# Patient Record
Sex: Female | Born: 1980 | Race: White | Hispanic: No | Marital: Married | Smoking: Former smoker
Health system: Southern US, Community
[De-identification: ages and names within clinical notes are randomized; demographics above are authoritative.]

## PROBLEM LIST (undated history)

## (undated) DIAGNOSIS — F3281 Premenstrual dysphoric disorder: Secondary | ICD-10-CM

## (undated) DIAGNOSIS — Z87442 Personal history of urinary calculi: Secondary | ICD-10-CM

## (undated) DIAGNOSIS — K219 Gastro-esophageal reflux disease without esophagitis: Secondary | ICD-10-CM

## (undated) DIAGNOSIS — T7840XA Allergy, unspecified, initial encounter: Secondary | ICD-10-CM

## (undated) DIAGNOSIS — N943 Premenstrual tension syndrome: Secondary | ICD-10-CM

## (undated) HISTORY — DX: Gastro-esophageal reflux disease without esophagitis: K21.9

## (undated) HISTORY — DX: Premenstrual dysphoric disorder: F32.81

## (undated) HISTORY — DX: Premenstrual tension syndrome: N94.3

## (undated) HISTORY — DX: Allergy, unspecified, initial encounter: T78.40XA

## (undated) HISTORY — DX: Personal history of urinary calculi: Z87.442

---
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
## 2000-10-23 ENCOUNTER — Other Ambulatory Visit: Admission: RE | Admit: 2000-10-23 | Discharge: 2000-10-23 | Payer: Self-pay | Admitting: Obstetrics and Gynecology

## 2001-02-12 ENCOUNTER — Inpatient Hospital Stay (HOSPITAL_COMMUNITY): Admission: AD | Admit: 2001-02-12 | Discharge: 2001-02-12 | Payer: Self-pay | Admitting: Obstetrics and Gynecology

## 2001-04-21 ENCOUNTER — Inpatient Hospital Stay (HOSPITAL_COMMUNITY): Admission: AD | Admit: 2001-04-21 | Discharge: 2001-04-21 | Payer: Self-pay | Admitting: Obstetrics and Gynecology

## 2001-04-21 ENCOUNTER — Encounter: Payer: Self-pay | Admitting: *Deleted

## 2001-04-26 ENCOUNTER — Inpatient Hospital Stay (HOSPITAL_COMMUNITY): Admission: AD | Admit: 2001-04-26 | Discharge: 2001-04-26 | Payer: Self-pay | Admitting: Obstetrics and Gynecology

## 2001-05-29 ENCOUNTER — Inpatient Hospital Stay (HOSPITAL_COMMUNITY): Admission: AD | Admit: 2001-05-29 | Discharge: 2001-06-01 | Payer: Self-pay | Admitting: *Deleted

## 2001-07-09 ENCOUNTER — Other Ambulatory Visit: Admission: RE | Admit: 2001-07-09 | Discharge: 2001-07-09 | Payer: Self-pay | Admitting: Obstetrics and Gynecology

## 2002-07-11 ENCOUNTER — Other Ambulatory Visit: Admission: RE | Admit: 2002-07-11 | Discharge: 2002-07-11 | Payer: Self-pay | Admitting: Obstetrics and Gynecology

## 2003-08-07 ENCOUNTER — Other Ambulatory Visit: Admission: RE | Admit: 2003-08-07 | Discharge: 2003-08-07 | Payer: Self-pay | Admitting: Obstetrics and Gynecology

## 2006-09-05 ENCOUNTER — Ambulatory Visit: Payer: Self-pay | Admitting: Family Medicine

## 2008-01-27 ENCOUNTER — Ambulatory Visit: Payer: Self-pay | Admitting: Family Medicine

## 2008-02-17 ENCOUNTER — Ambulatory Visit: Payer: Self-pay | Admitting: Family Medicine

## 2008-03-16 ENCOUNTER — Ambulatory Visit: Payer: Self-pay | Admitting: Family Medicine

## 2008-06-03 ENCOUNTER — Ambulatory Visit: Payer: Self-pay | Admitting: Family Medicine

## 2009-04-27 ENCOUNTER — Ambulatory Visit: Payer: Self-pay | Admitting: Family Medicine

## 2009-11-16 ENCOUNTER — Ambulatory Visit: Payer: Self-pay | Admitting: Family Medicine

## 2009-12-07 ENCOUNTER — Ambulatory Visit: Payer: Self-pay | Admitting: Family Medicine

## 2009-12-07 ENCOUNTER — Encounter: Admission: RE | Admit: 2009-12-07 | Discharge: 2009-12-07 | Payer: Self-pay | Admitting: Family Medicine

## 2009-12-21 ENCOUNTER — Ambulatory Visit: Payer: Self-pay | Admitting: Physician Assistant

## 2010-09-09 NOTE — Consult Note (Signed)
[facility]  Patient:    Laura Mckee, Laura Mckee Visit Number: [id number] MRN: [id number]          Service Type: OBS Location: [facility] Attending Physician:  Esmeralda Arthur Dictated by:   Silverio Lay, M.D. Consultation Date: 02/12/01 Admit Date:  02/12/2001                            Consultation Report  EMERGENCY ROOM VISIT  REASON FOR CONSULTATION:      Gross hematuria.  HISTORY OF PRESENT ILLNESS:   This is a 30 year old, married, white female, gravida 2, para 1, abortus 0, with a due date of May 28, 2001, at 24 weeks pregnancy who called in tonight complaining of gross hematuria and lower pelvic soreness.  She reports being known in her previous pregnancy for urolithiasis kidney stone with no need for treatment.  She reports good fetal activity, denies any vaginal bleeding, denies any fever, denies any leakage of water.  The patient was asked to come to maternity admission to evaluate gross hematuria and to rule out any infectious process.  ALLERGIES:                    No known drug allergies.  PAST MEDICAL HISTORY:         October 1999, spontaneous vaginal delivery of a female infant at term with no complication.  FAMILY HISTORY:               Unremarkable.  SOCIAL HISTORY:               Married, nonsmoker, is a Runner, broadcasting/film/video.  PHYSICAL EXAMINATION:  VITAL SIGNS:                  Normal.  GENERAL:                      No acute distress.  HEENT:                        Negative.  LUNGS:                        Clear.  HEART:                        Normal.  ABDOMEN:                      Gravid, nontender.  Size adequate for dates.  No CVA tenderness, no rebound, no guarding.  EXTREMITIES:                  Negative.  Fetal heart rate baseline between 140 and 150, reassuring.  LABORATORY DATA:              UA shows straw-colored urine, large hemoglobin, otherwise negative.  CBC shows a normal white blood cell count, normal hemoglobin  at 12.4.  ASSESSMENT:                   Intrauterine pregnancy at 24+ weeks with acute episode of gross hematuria, now resolving with increased hydration.  No evidence of pyelonephritis or urinary tract infection.  This is probably secondary to kidney stone.  PLAN:  The patient is discharged home with reassurances, asked to increase her water intake, and to use Tylenol if necessary.  She declines any pain medication prescription.  She is instructed to follow up in the office in 48 hours as scheduled but to call if gross hematuria reoccurs or if pain increases or if fever develops. Dictated by:   Silverio Lay, M.D. Attending Physician:  Esmeralda Arthur DD:  02/12/01 TD:  02/12/01 Job: [id number]

## 2010-09-09 NOTE — H&P (Signed)
[facility]  Patient:    Laura Mckee, Laura Mckee Visit Number: [id number] MRN: [id number]          Service Type: OBS Location: 910B [id number] [id number] Attending Physician:  Ermalene Searing Dictated by:   Sheria Lang Cherly Hensen, M.D. Admit Date:  05/29/2001                           History and Physical  CHIEF COMPLAINT:              Postdates, induction of labor.  HISTORY OF PRESENT ILLNESS:   This is a 30 year old gravida 2 para 1, 0-0-1, female with an EDC of May 28, 2001 by ultrasound, who is now at 40-1/7th weeks gestation, admitted for induction of labor.  The patients prenatal course has been complicated by a kidney stone which she passed spontaneously; however, she has documented colonization by group B streptococcus with a positive urine culture as well as a positive anal/vaginal culture.  She is currently on Macrobid for a second urinary tract infection.  The patient has been noted to have size less than dates and several ultrasounds, including the last one on May 24, 2001, showed estimated fetal weight of 7 pounds 1 ounce, which is at the 24th percentile; normal amniotic fluid index.  The patient has not had any contractions.  Her cervix was 1, thick, and about -2 vertex presentation.  She has a history of a 2-1/2 hour labor in the past. Prenatal care is at [facility].  Primary obstetrician, Sheronette A. Cherly Hensen, M.D.  Blood type is O-positive.  Antibody screen negative.  RPR nonreactive.  Rubella immune.   Hepatitis B surface antigen negative.  HIV test nonreactive.  GC and Chlamydia cultures negative.  Pap smear normal.  The patient declined AFP 3 testing.  Anatomic fetal survey done on January 15, 2001 at 20.6 weeks was normal.  Limits were discussed at that time.  The patient had a normal one hour GCT.  Group B strep culture is positive. Patient initially presented with right flank pain in December 2002 with findings of moderate to  severe hydronephrosis, right greater than left, and subsequently had a kidney stone that passed.  The patient had been by urology with a positive urine culture of greater than 100,000 group B strep.  The patient was treated with Tylox until resolution of symptoms.  On May 24, 2001 the patient was complaining of gross hematuria.  Urine culture was sent, which was subsequently negative; however, the patient was started on Macrobid 100 mg p.o. b.i.d., which she will continue.  ALLERGIES:                    No known drug allergies.  MEDICATIONS:                  1. Prenatal vitamins.                               2. Macrobid.  PAST MEDICAL HISTORY:         Negative.  PAST SURGICAL HISTORY:        Negative.  PAST OBSTETRICAL HISTORY:     October 1999, 7 pounds 5 ounce baby, 2-1/2 hours labor; vaginal delivery.  FAMILY HISTORY:               Maternal grandfather, colon cancer.  SOCIAL HISTORY:  Married, one child.  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  REVIEW OF SYSTEMS:            Negative except as noted in the History of Present Illness.  PHYSICAL EXAMINATION:  GENERAL:                      Petite white gravid female, in no acute distress.  VITAL SIGNS:                  Blood pressure 110/70.  Weight 132.4 pounds.  SKIN:                         No lesions.  HEENT:                        Sclerae anicteric.  Conjunctivae pink. Oropharynx negative.  HEART:                        Regular rate and rhythm without murmur.  LUNGS:                        Clear to auscultation.  BREAST:                       Soft, nontender.  No palpable mass.  ABDOMEN:                      Gravid, fundal height 34 cm.  EXTREMITIES:                  No edema.  PELVIC:                       One, thick, -2 vertex presentation.  IMPRESSION:                   1. Term gestation.                               2. Group B streptococci culture positive.                                3. Presumed urinary tract infection.  PLAN:                         1. Admission.                               2. Cervical ripening.                               3. Pitocin augmentation/induction on                                  May 30, 2001.                               4. Continue Macrobid antibiotic regimen.  5. Routine admission orders and laboratories.                               5. Penicillin prophylaxis for the positive                                  group B strep culture. Dictated by:   Sheria Lang. Cherly Hensen, M.D. Attending Physician:  Marina Gravel B DD:  05/30/01 TD:  05/30/01 Job: [id number]

## 2011-01-03 LAB — HM PAP SMEAR: HM Pap smear: NORMAL

## 2011-03-02 ENCOUNTER — Ambulatory Visit (INDEPENDENT_AMBULATORY_CARE_PROVIDER_SITE_OTHER): Payer: BC Managed Care – PPO | Admitting: Internal Medicine

## 2011-03-02 ENCOUNTER — Encounter: Payer: Self-pay | Admitting: Internal Medicine

## 2011-03-02 VITALS — BP 90/60 | HR 84 | Temp 98.6°F | Resp 18 | Ht 59.5 in | Wt 96.0 lb

## 2011-03-02 DIAGNOSIS — J329 Chronic sinusitis, unspecified: Secondary | ICD-10-CM

## 2011-03-02 DIAGNOSIS — N390 Urinary tract infection, site not specified: Secondary | ICD-10-CM

## 2011-03-02 DIAGNOSIS — M549 Dorsalgia, unspecified: Secondary | ICD-10-CM

## 2011-03-02 HISTORY — PX: NO PAST SURGERIES: SHX2092

## 2011-03-02 LAB — BASIC METABOLIC PANEL
BUN: 14 mg/dL (ref 6–23)
CO2: 26 mEq/L (ref 19–32)
Calcium: 9.5 mg/dL (ref 8.4–10.5)
Chloride: 104 mEq/L (ref 96–112)
Creat: 0.71 mg/dL (ref 0.50–1.10)
Glucose, Bld: 74 mg/dL (ref 70–99)
Potassium: 3.8 mEq/L (ref 3.5–5.3)
Sodium: 141 mEq/L (ref 135–145)

## 2011-03-02 LAB — CBC WITH DIFFERENTIAL/PLATELET
Basophils Absolute: 0 10*3/uL (ref 0.0–0.1)
Basophils Relative: 0 % (ref 0–1)
Eosinophils Absolute: 0 10*3/uL (ref 0.0–0.7)
Eosinophils Relative: 0 % (ref 0–5)
HCT: 41.5 % (ref 36.0–46.0)
Hemoglobin: 14 g/dL (ref 12.0–15.0)
Lymphocytes Relative: 19 % (ref 12–46)
Lymphs Abs: 1.6 10*3/uL (ref 0.7–4.0)
MCH: 32.5 pg (ref 26.0–34.0)
MCHC: 33.7 g/dL (ref 30.0–36.0)
MCV: 96.3 fL (ref 78.0–100.0)
Monocytes Absolute: 0.6 10*3/uL (ref 0.1–1.0)
Monocytes Relative: 7 % (ref 3–12)
Neutro Abs: 6.2 10*3/uL (ref 1.7–7.7)
Neutrophils Relative %: 73 % (ref 43–77)
Platelets: 189 10*3/uL (ref 150–400)
RBC: 4.31 MIL/uL (ref 3.87–5.11)
RDW: 12.3 % (ref 11.5–15.5)
WBC: 8.5 10*3/uL (ref 4.0–10.5)

## 2011-03-02 LAB — URINALYSIS, ROUTINE W REFLEX MICROSCOPIC
Bilirubin Urine: NEGATIVE
Glucose, UA: NEGATIVE mg/dL
Ketones, ur: NEGATIVE mg/dL
Leukocytes, UA: NEGATIVE
Nitrite: NEGATIVE
Protein, ur: NEGATIVE mg/dL
Specific Gravity, Urine: 1.025 (ref 1.005–1.030)
Urobilinogen, UA: 0.2 mg/dL (ref 0.0–1.0)
pH: 5.5 (ref 5.0–8.0)

## 2011-03-02 MED ORDER — LEVOFLOXACIN 500 MG PO TABS: 500.0000 mg | ORAL_TABLET | Freq: Every day | ORAL | Status: AC

## 2011-03-02 NOTE — Progress Notes (Signed)
  Subjective:    Patient ID: Laura Mckee, female    DOB: 1980-10-25, 30 y.o.   MRN: [id number]  HPI Pt presents to clinic for evaluation of back pain. Has noted increase in urinary frequency and lbp not affected by position and not related to injury. UA obtained by gyn ~4wks ago and was given 3d course of septra. Notes improvement of urine freq but not back pain. aloso notes sinus pain/pressure, nasal congestion and drainage for 5-6d. No fever chills. No alleviating or exacerbating factors.  Reviewed pmh, psh, medications, allergies, soc hx and fam hx  Review of Systems  Constitutional: Negative for fever and chills.  Genitourinary: Positive for frequency. Negative for hematuria.  Musculoskeletal: Positive for back pain.  All other systems reviewed and are negative.       Objective:   Physical Exam  Nursing note and vitals reviewed. Constitutional: She appears well-developed and well-nourished. No distress.  HENT:  Head: Normocephalic and atraumatic.  Right Ear: External ear normal.  Left Ear: External ear normal.  Eyes: Conjunctivae are normal. No scleral icterus.  Neck: Neck supple.  Abdominal: Soft. She exhibits no distension. There is no tenderness.  Musculoskeletal: She exhibits no tenderness.       Lumbar back: She exhibits normal range of motion, no tenderness, no bony tenderness and no spasm.  Neurological: She is alert.  Skin: Skin is warm and dry. She is not diaphoretic.  Psychiatric: She has a normal mood and affect.          Assessment & Plan:

## 2011-03-03 LAB — URINALYSIS, MICROSCOPIC ONLY
Bacteria, UA: NONE SEEN
Casts: NONE SEEN
Crystals: NONE SEEN

## 2011-03-04 LAB — URINE CULTURE: Colony Count: 4000

## 2011-03-05 DIAGNOSIS — M549 Dorsalgia, unspecified: Secondary | ICD-10-CM | POA: Insufficient documentation

## 2011-03-05 DIAGNOSIS — J329 Chronic sinusitis, unspecified: Secondary | ICD-10-CM | POA: Insufficient documentation

## 2011-03-05 NOTE — Assessment & Plan Note (Signed)
Attempt po course of abx. Followup if no improvement or worsening.

## 2011-03-05 NOTE — Assessment & Plan Note (Signed)
Obtain cbc, chem7, ua and cx. Attempt 7d course of levaquin. Followup if no improvement or worsening.

## 2011-03-09 ENCOUNTER — Ambulatory Visit: Payer: BC Managed Care – PPO | Admitting: Internal Medicine

## 2011-03-09 DIAGNOSIS — Z0289 Encounter for other administrative examinations: Secondary | ICD-10-CM

## 2011-05-21 ENCOUNTER — Emergency Department (HOSPITAL_COMMUNITY)
Admission: EM | Admit: 2011-05-21 | Discharge: 2011-05-22 | Disposition: A | Discharge status: Home / Self Care | Payer: BC Managed Care – PPO | Attending: Emergency Medicine | Admitting: Emergency Medicine

## 2011-05-21 ENCOUNTER — Encounter (HOSPITAL_COMMUNITY): Payer: Self-pay | Admitting: Emergency Medicine

## 2011-05-21 DIAGNOSIS — R110 Nausea: Secondary | ICD-10-CM | POA: Insufficient documentation

## 2011-05-21 DIAGNOSIS — K219 Gastro-esophageal reflux disease without esophagitis: Secondary | ICD-10-CM | POA: Insufficient documentation

## 2011-05-21 DIAGNOSIS — R1011 Right upper quadrant pain: Secondary | ICD-10-CM | POA: Insufficient documentation

## 2011-05-21 LAB — CBC
HCT: 39 % (ref 36.0–46.0)
Hemoglobin: 13.7 g/dL (ref 12.0–15.0)
MCH: 32.5 pg (ref 26.0–34.0)
RBC: 4.21 MIL/uL (ref 3.87–5.11)

## 2011-05-21 LAB — COMPREHENSIVE METABOLIC PANEL
ALT: 14 U/L (ref 0–35)
AST: 16 U/L (ref 0–37)
Albumin: 4.5 g/dL (ref 3.5–5.2)
Alkaline Phosphatase: 54 U/L (ref 39–117)
Calcium: 10 mg/dL (ref 8.4–10.5)
GFR calc Af Amer: >90 mL/min (ref >90)
Glucose, Bld: 98 mg/dL (ref 70–99)
Potassium: 3.8 mEq/L (ref 3.5–5.1)
Sodium: 140 mEq/L (ref 135–145)
Total Protein: 7.2 g/dL (ref 6.0–8.3)

## 2011-05-21 LAB — DIFFERENTIAL
Eosinophils Absolute: 0 10*3/uL (ref 0.0–0.7)
Lymphs Abs: 1.8 10*3/uL (ref 0.7–4.0)
Monocytes Absolute: 0.6 10*3/uL (ref 0.1–1.0)
Monocytes Relative: 7 % (ref 3–12)
Neutrophils Relative %: 72 % (ref 43–77)

## 2011-05-21 LAB — URINALYSIS, ROUTINE W REFLEX MICROSCOPIC
Leukocytes, UA: NEGATIVE
Nitrite: NEGATIVE
Specific Gravity, Urine: 1.014 (ref 1.005–1.030)
Urobilinogen, UA: 0.2 mg/dL (ref 0.0–1.0)
pH: 6 (ref 5.0–8.0)

## 2011-05-21 LAB — POCT PREGNANCY, URINE: Preg Test, Ur: NEGATIVE

## 2011-05-21 LAB — URINE MICROSCOPIC-ADD ON

## 2011-05-21 NOTE — ED Notes (Signed)
C/o RUQ pain, nausea, and constipation x 1 week.

## 2011-05-21 NOTE — ED Provider Notes (Signed)
History     CSN: [id number]  Arrival date & time 05/21/11  1810   First MD Initiated Contact with Patient 05/21/11 1958      Chief Complaint  Patient presents with  . Abdominal Pain    (Consider location/radiation/quality/duration/timing/severity/associated sxs/prior treatment) HPI Complains of right upper quadrant pain radiating to right flank onset approximately 2 weeks ago accompanied by nausea. Worse with eating. No fever no other complaint no treatment prior to coming here pain not improved by anything. Pain is mild at present has never had similar pain before. Pain is dull in nature. Past Medical History  Diagnosis Date  . PMS (premenstrual syndrome)     depression  . GERD (gastroesophageal reflux disease)   . History of kidney stones 1999 & 2003    x 2 with pregnancies    Past Surgical History  Procedure Date  . No past surgeries 03/02/2011    Denies surgical history    Family History  Problem Relation Age of Onset  . Colon cancer Maternal Grandfather     History  Substance Use Topics  . Smoking status: Former Games developer  . Smokeless tobacco: Never Used   Comment: 1/2 pack a day  for  3 years  . Alcohol Use: No    OB History    Grav Para Term Preterm Abortions TAB SAB Ect Mult Living                  Review of Systems  Constitutional: Negative.   HENT: Negative.   Respiratory: Negative.   Cardiovascular: Negative.   Gastrointestinal: Positive for nausea and abdominal pain.  Musculoskeletal: Negative.   Skin: Negative.   Neurological: Negative.   Hematological: Negative.   Psychiatric/Behavioral: Negative.   All other systems reviewed and are negative.    Allergies  Review of patient's allergies indicates no known allergies.  Home Medications   Current Outpatient Rx  Name Route Sig Dispense Refill  . SERTRALINE HCL 50 MG PO TABS Oral Take 50 mg by mouth every morning.       BP 109/76  Pulse 88  Temp(Src) 98.4 F (36.9 C) (Oral)  Resp  20  SpO2 99%  LMP 05/16/2011  Physical Exam  Nursing note and vitals reviewed. Constitutional: She appears well-developed and well-nourished.  HENT:  Head: Normocephalic and atraumatic.  Eyes: Conjunctivae are normal. Pupils are equal, round, and reactive to light.  Neck: Neck supple. No tracheal deviation present. No thyromegaly present.  Cardiovascular: Normal rate and regular rhythm.   No murmur heard. Pulmonary/Chest: Effort normal and breath sounds normal.  Abdominal: Soft. Bowel sounds are normal. She exhibits no distension and no mass. There is tenderness. There is no rebound and no guarding.       Mildly tender right upper quadrant no guarding rigidity or rebound  Musculoskeletal: Normal range of motion. She exhibits no edema and no tenderness.  Neurological: She is alert. Coordination normal.  Skin: Skin is warm and dry. No rash noted.  Psychiatric: She has a normal mood and affect.    ED Course  Procedures (including critical care time)  Labs Reviewed  URINALYSIS, ROUTINE W REFLEX MICROSCOPIC - Abnormal; Notable for the following:    Hgb urine dipstick MODERATE (*)    All other components within normal limits  URINE MICROSCOPIC-ADD ON - Abnormal; Notable for the following:    Squamous Epithelial / LPF FEW (*)    All other components within normal limits  POCT PREGNANCY, URINE  CBC  DIFFERENTIAL  POCT PREGNANCY, URINE  COMPREHENSIVE METABOLIC PANEL  LIPASE, BLOOD  POCT PREGNANCY, URINE   No results found.   No diagnosis found.    MDM   toorder abdominal ultrasound 10:30 PM as patient last ate at 4:30 PM today Suspect biliary colic Diagnosis abdominal pain        Doug Sou, MD 05/21/11 2112

## 2011-05-21 NOTE — ED Notes (Signed)
Report given to Christa RN

## 2011-05-21 NOTE — ED Provider Notes (Signed)
10:52 PM Pending ultrasound. Patient continues to have abdominal pain but does not require medication. Reports intermittent right upper quadrant pain, and nausea after eating for one week. Also reports significant constipation. Patient is slightly tender in the epigastric region. No rigidity, rebound, or guarding. Bowel sounds within normal limits. Vital signs normal. Dr. Rennis Chris will continue to manage patient.  Thomasene Lot, PA-C 05/21/11 2257

## 2011-05-21 NOTE — ED Notes (Signed)
Pt states last meal at 4:30 pm 05/21/2011

## 2011-05-22 ENCOUNTER — Telehealth: Payer: Self-pay | Admitting: *Deleted

## 2011-05-22 ENCOUNTER — Emergency Department (HOSPITAL_COMMUNITY): Payer: BC Managed Care – PPO

## 2011-05-22 MED ORDER — OXYCODONE-ACETAMINOPHEN 5-325 MG PO TABS: 1.0000 | ORAL_TABLET | ORAL | Status: AC | PRN

## 2011-05-22 MED ORDER — ONDANSETRON 4 MG PO TBDP: 8.0000 mg | ORAL_TABLET | Freq: Once | ORAL | Status: DC

## 2011-05-22 MED ORDER — OXYCODONE-ACETAMINOPHEN 5-325 MG PO TABS: 1.0000 | ORAL_TABLET | Freq: Once | ORAL | Status: DC

## 2011-05-22 MED ORDER — FAMOTIDINE 20 MG PO TABS: 20.0000 mg | ORAL_TABLET | Freq: Two times a day (BID) | ORAL | Status: DC

## 2011-05-22 NOTE — ED Notes (Signed)
Patient transported to Ultrasound 

## 2011-05-22 NOTE — ED Provider Notes (Signed)
Medical screening examination/treatment/procedure(s) were conducted as a shared visit with non-physician practitioner(s) and myself.  I personally evaluated the patient during the encounter  Doug Sou, MD 05/22/11 [phone]

## 2011-05-22 NOTE — Telephone Encounter (Signed)
Call Report Triage Record Num: [id number] Operator: Kelle Darting Patient Name: Laura Mckee Call Date & Time: 05/21/2011 5:06:30PM Patient Phone: [phone] PCP: Marguarite Arbour Patient Gender: Female PCP Fax : [phone] Patient DOB: May 28, 1980 Practice Name: [facility] Reason for Call: Caller: Correna/Patient; PCP: Marguarite Arbour; CB#: [phone]; Call regarding Pain under right side of rib cage, Fever; LMP: 05/14/11; Temp. at 99.3 (ax) 1630; Onset: 05/20/11; Sx notes: Has had this pain coming and going for the past three weeks but has gotten constant yesterday, and today has progressed to her Rt. upper back/shoulder blade area that comes and goes, also lower back as well, some nausea, also more belching, pain worse right after eating; Guideline used: Abd. pain; Disp: See ED Immediately due to abd. pain that has progressed to a localized pain and loss of appetite, nausea, fever; Appt. made: No. Protocol(s) Used: Abdominal Pain Recommended Outcome per Protocol: See ED Immediately Reason for Outcome: Generalized abdominal pain that progresses to localized pain AND any of the following: loss of appetite, vomiting starting after pain, any fever, OR unable to carry out normal activities Care Advice: ~ Another adult should drive. ~ Do not eat or drink anything until evaluated by provider. 05/21/2011 5:20:52PM Page 1 of 1 [email or web address]

## 2011-05-22 NOTE — ED Provider Notes (Signed)
Labs/imaging negative BP 109/76  Pulse 88  Temp(Src) 98.4 F (36.9 C) (Oral)  Resp 20  SpO2 99%  LMP 05/16/2011 Pt stable at this time abd soft, no lower abd tenderness No cp/sob reported Well appearing Discussed strict return precautions Stable for d/c   Joya Gaskins, MD 05/22/11 [phone]

## 2011-05-22 NOTE — ED Notes (Signed)
Phoned ultrasound to inquire about estimated time for patient scan. Tech will come and get patient shortly. Delayed explained to patient and family. Verbalized understanding.

## 2011-05-25 ENCOUNTER — Telehealth: Payer: Self-pay | Admitting: *Deleted

## 2011-05-25 NOTE — Telephone Encounter (Signed)
Pt.notified

## 2011-05-25 NOTE — Telephone Encounter (Signed)
ED note says ruq pain and [place] and labs were done. If constipation concern then colace 100mg  bid. Take 1/2 bottle of mag citrate over ice. Repeat in 2 hours if no bm. If no bm or sx's continue then needs to be seen

## 2011-05-25 NOTE — Telephone Encounter (Signed)
Patient called and left voice message stating she has not had a regular bowel movement for the past 2-3 weeks, and she is having to strain to have a bowel movement when she does. She would like to know if there is something Dr Rodena Medin would suggest overt the counter for relief. She states she was seen in the Er and she continues to have abdominal cramping and discomfort.

## 2012-01-02 ENCOUNTER — Encounter: Payer: Self-pay | Admitting: Internal Medicine

## 2012-01-02 ENCOUNTER — Ambulatory Visit (INDEPENDENT_AMBULATORY_CARE_PROVIDER_SITE_OTHER): Payer: BC Managed Care – PPO | Admitting: Internal Medicine

## 2012-01-02 VITALS — BP 100/70 | HR 77 | Temp 98.2°F | Resp 16 | Wt 107.0 lb

## 2012-01-02 DIAGNOSIS — M25539 Pain in unspecified wrist: Secondary | ICD-10-CM

## 2012-01-02 DIAGNOSIS — M549 Dorsalgia, unspecified: Secondary | ICD-10-CM

## 2012-01-02 MED ORDER — DICLOFENAC SODIUM 50 MG PO TBEC: DELAYED_RELEASE_TABLET | ORAL | Status: AC

## 2012-01-02 NOTE — Progress Notes (Signed)
  Subjective:    Patient ID: Laura Mckee, female    DOB: June 07, 1980, 31 y.o.   MRN: [id number]  HPI Pt presents to clinic for evaluation of wrist pain. Notes six week history of left wrist pain with a tender area over the dorsal aspect approximately last three weeks. No injury or trauma. Pain occurred after using lawn equipment. Taking Motrin when necessary. No other alleviating or exacerbating factors. Also notes mild thoracic back pain for the past two weeks without radicular symptoms injury or trauma.   Past Medical History  Diagnosis Date  . PMS (premenstrual syndrome)     depression  . GERD (gastroesophageal reflux disease)   . History of kidney stones 1999 & 2003    x 2 with pregnancies   Past Surgical History  Procedure Date  . No past surgeries 03/02/2011    Denies surgical history    reports that she has quit smoking. She has never used smokeless tobacco. She reports that she does not drink alcohol or use illicit drugs. family history includes Colon cancer in her maternal grandfather. No Known Allergies   Review of Systems see hpi     Objective:   Physical Exam  Nursing note and vitals reviewed. Constitutional: She appears well-developed and well-nourished. No distress.  HENT:  Head: Normocephalic and atraumatic.  Musculoskeletal:       Left wrist-full range of motion. Mild tenderness along the dorsal tendon lines. No bony abnormality. No erythema warmth or effusion. Sensation grossly intact. +2 radial pulse  Neurological: She is alert.  Skin: She is not diaphoretic.  Psychiatric: She has a normal mood and affect.          Assessment & Plan:

## 2012-01-06 DIAGNOSIS — M25539 Pain in unspecified wrist: Secondary | ICD-10-CM | POA: Insufficient documentation

## 2012-01-06 NOTE — Assessment & Plan Note (Signed)
Suspect musculoskeletal etiology. Attempt Voltaren when necessary. Follow up with no improvement or worsening.

## 2012-01-06 NOTE — Assessment & Plan Note (Signed)
Suspect tendinitis. Stop Motrin. Attempt Voltaren. Followup if no improvement or worsening.

## 2012-07-25 ENCOUNTER — Ambulatory Visit (INDEPENDENT_AMBULATORY_CARE_PROVIDER_SITE_OTHER): Payer: BC Managed Care – PPO | Admitting: Family

## 2012-07-25 ENCOUNTER — Encounter: Payer: Self-pay | Admitting: Family

## 2012-07-25 VITALS — BP 100/76 | HR 104 | Temp 98.4°F | Resp 16 | Wt 103.0 lb

## 2012-07-25 DIAGNOSIS — G5620 Lesion of ulnar nerve, unspecified upper limb: Secondary | ICD-10-CM

## 2012-07-25 DIAGNOSIS — M549 Dorsalgia, unspecified: Secondary | ICD-10-CM

## 2012-07-25 DIAGNOSIS — G5622 Lesion of ulnar nerve, left upper limb: Secondary | ICD-10-CM

## 2012-07-25 MED ORDER — MELOXICAM 7.5 MG PO TABS: 7.5000 mg | ORAL_TABLET | Freq: Every day | ORAL | Status: DC

## 2012-07-25 NOTE — Progress Notes (Signed)
  Subjective:    Patient ID: Laura Mckee, female    DOB: 1981/02/24, 32 y.o.   MRN: [id number]  HPI  [name] is a 32 yr old female who presents today with two concerns:  1) Wrist pain- left wrist.  Occasional tingling 4th and 5th fingers.  She saw Dr. Rodena Medin 7 months ago for this.  Off/on since that time.   2) Back Pain- Reports that her back pain is located in the mid back and wraps around the right side of her back.  Pain is on/off.  Pain is improved by ice/warm compresses help briefly.  Pain is worse with rotating her back/turning.    Review of Systems See HPI  Past Medical History  Diagnosis Date  . PMS (premenstrual syndrome)     depression  . GERD (gastroesophageal reflux disease)   . History of kidney stones 1999 & 2003    x 2 with pregnancies    History   Social History  . Marital Status: Married    Spouse Name: N/A    Number of Children: N/A  . Years of Education: N/A   Occupational History  . Not on file.   Social History Main Topics  . Smoking status: Former Games developer  . Smokeless tobacco: Never Used     Comment: 1/2 pack a day  for  3 years  . Alcohol Use: No  . Drug Use: No  . Sexually Active: Not on file   Other Topics Concern  . Not on file   Social History Narrative  . No narrative on file    Past Surgical History  Procedure Laterality Date  . No past surgeries  03/02/2011    Denies surgical history    Family History  Problem Relation Age of Onset  . Colon cancer Maternal Grandfather     No Known Allergies  Current Outpatient Prescriptions on File Prior to Visit  Medication Sig Dispense Refill  . sertraline (ZOLOFT) 50 MG tablet Take 50 mg by mouth every morning.        No current facility-administered medications on file prior to visit.    BP 100/76  Pulse 104  Temp(Src) 98.4 F (36.9 C) (Oral)  Resp 16  Wt 103 lb 0.6 oz (46.739 kg)  BMI 20.47 kg/m2  SpO2 99%       Objective:   Physical Exam  Constitutional: She  is oriented to person, place, and time. She appears well-developed and well-nourished. No distress.  Cardiovascular: Normal rate and regular rhythm.   No murmur heard. Pulmonary/Chest: Effort normal and breath sounds normal. No respiratory distress. She has no wheezes. She has no rales. She exhibits no tenderness.  Musculoskeletal:       Lumbar back: She exhibits no tenderness.       Left forearm: She exhibits no tenderness and no swelling.  bilat LE  Strength is 5/5. bilat UE strength is 5/5.   Neurological: She is alert and oriented to person, place, and time.          Assessment & Plan:

## 2012-07-25 NOTE — Patient Instructions (Signed)
You will be contacted about your referrals to ortho and physical therapy.  Please let us know if you have not heard back within 1 week about your referral.

## 2012-07-27 DIAGNOSIS — G5620 Lesion of ulnar nerve, unspecified upper limb: Secondary | ICD-10-CM | POA: Insufficient documentation

## 2012-07-27 NOTE — Assessment & Plan Note (Signed)
Trial of meloxicam, refer for PT. If no improvement, plan MRI.

## 2012-07-27 NOTE — Assessment & Plan Note (Signed)
Symptoms most consistent with left ulnar nerve comp. Trial of meloxicam, refer to ortho.o

## 2012-08-08 ENCOUNTER — Telehealth: Payer: Self-pay | Admitting: Family

## 2012-08-08 NOTE — Telephone Encounter (Signed)
WANTS TO GO TO [facility] FOR IRRITABLE BOWEL

## 2012-08-08 NOTE — Telephone Encounter (Signed)
Spoke with pt, she reports previous episode of abdominal pain and negative work up in the ER. Pt states she continues to have intermittent right side abdominal pain and constipation. Advised pt of need for evaluation in the office prior to referral. Pt denies n/v. Scheduled f/u for 08/13/12 at 10:45am. Advised pt if symptoms worsen or pain becomes severe to seek evaluation in the ER and do not wait for appt on 08/13/12. Pt voices understanding.

## 2012-08-13 ENCOUNTER — Ambulatory Visit: Payer: BC Managed Care – PPO | Admitting: Family

## 2013-10-22 ENCOUNTER — Encounter: Payer: Self-pay | Admitting: Physician Assistant

## 2013-10-22 ENCOUNTER — Ambulatory Visit (HOSPITAL_BASED_OUTPATIENT_CLINIC_OR_DEPARTMENT_OTHER)
Admission: RE | Admit: 2013-10-22 | Discharge: 2013-10-22 | Disposition: A | Discharge status: Home / Self Care | Payer: BC Managed Care – PPO | Source: Ambulatory Visit | Attending: Physician Assistant | Admitting: Physician Assistant

## 2013-10-22 ENCOUNTER — Ambulatory Visit (INDEPENDENT_AMBULATORY_CARE_PROVIDER_SITE_OTHER): Payer: BC Managed Care – PPO | Admitting: Physician Assistant

## 2013-10-22 VITALS — BP 100/70 | HR 83 | Temp 98.9°F | Resp 14 | Ht 59.5 in | Wt 93.0 lb

## 2013-10-22 DIAGNOSIS — M542 Cervicalgia: Secondary | ICD-10-CM

## 2013-10-22 MED ORDER — TRAMADOL HCL 50 MG PO TABS: 50.0000 mg | ORAL_TABLET | Freq: Two times a day (BID) | ORAL | Status: AC | PRN

## 2013-10-22 MED ORDER — METHYLPREDNISOLONE (PAK) 4 MG PO TABS: ORAL_TABLET | ORAL | Status: DC

## 2013-10-22 NOTE — Assessment & Plan Note (Signed)
With radiculopathy.  Will start with xray of cervical spine.  Rx Medrol dose pack.  Rx Tramadol only if needed for severe pain.  Topical Icy hot. Avoid heavy lifting or overexertion. If symptoms persist, may need referral to Ortho.

## 2013-10-22 NOTE — Progress Notes (Signed)
Patient presents to clinic today c/o cervical neck pain with radiation to the LUE. Symptoms have been present intermittently for a couple of months, but more severe and frequent over the past month.  Patient endorses some numbness and heaviness of LUE.  Denies radiation of pain down her back.  Denies trauma or injury.  Endorses good ROM of L shoulder.  Past Medical History  Diagnosis Date  . PMS (premenstrual syndrome)     depression  . GERD (gastroesophageal reflux disease)   . History of kidney stones 1999 & 2003    x 2 with pregnancies    Current Outpatient Prescriptions on File Prior to Visit  Medication Sig Dispense Refill  . sertraline (ZOLOFT) 50 MG tablet Take 50 mg by mouth every morning.        No current facility-administered medications on file prior to visit.    No Known Allergies  Family History  Problem Relation Age of Onset  . Colon cancer Maternal Grandfather     History   Social History  . Marital Status: Married    Spouse Name: N/A    Number of Children: N/A  . Years of Education: N/A   Social History Main Topics  . Smoking status: Former Games developermoker  . Smokeless tobacco: Never Used     Comment: 1/2 pack a day  for  3 years  . Alcohol Use: No  . Drug Use: No  . Sexual Activity: None   Other Topics Concern  . None   Social History Narrative  . None   Review of Systems - See HPI.  All other ROS are negative.  BP 100/70  Pulse 83  Temp(Src) 98.9 F (37.2 C) (Oral)  Resp 14  Ht 4' 11.5" (1.511 m)  Wt 93 lb (42.185 kg)  BMI 18.48 kg/m2  SpO2 98%  Physical Exam  Vitals reviewed. Constitutional: She is oriented to person, place, and time and well-developed, well-nourished, and in no distress.  Cardiovascular: Normal rate, regular rhythm, normal heart sounds and intact distal pulses.   Pulmonary/Chest: Effort normal and breath sounds normal. No respiratory distress. She has no wheezes. She has no rales. She exhibits no tenderness.   Musculoskeletal:       Left shoulder: Normal. She exhibits normal range of motion and no tenderness.       Left elbow: Normal.       Left wrist: Normal.       Cervical back: She exhibits pain. She exhibits no tenderness and no bony tenderness.       Thoracic back: Normal.  Neurological: She is alert and oriented to person, place, and time.  Skin: Skin is warm and dry. No rash noted.  Psychiatric: Affect normal.   Assessment/Plan: Cervical pain (neck) With radiculopathy.  Will start with xray of cervical spine.  Rx Medrol dose pack.  Rx Tramadol only if needed for severe pain.  Topical Icy hot. Avoid heavy lifting or overexertion. If symptoms persist, may need referral to Ortho.

## 2013-10-22 NOTE — Patient Instructions (Signed)
Please go downstairs for imaging. I will call you with your results.  Take medications as directed.  Apply a topical Icy Hot to your neck.  Avoid heavy lifting or overexertion.  Symptoms should calm down.  If they persist, we will need to set you up with an Orthopedist.

## 2013-10-22 NOTE — Progress Notes (Signed)
Pre visit review using our clinic review tool, if applicable. No additional management support is needed unless otherwise documented below in the visit note/SLS  

## 2014-11-27 IMAGING — CR DG CERVICAL SPINE COMPLETE 4+V
5 series · 5 of 5 positions shown · non-contrast
Comparison: None.

CLINICAL DATA: Chronic neck pain for several weeks

EXAM:
CERVICAL SPINE  4+ VIEWS

[w c-spine lat *]
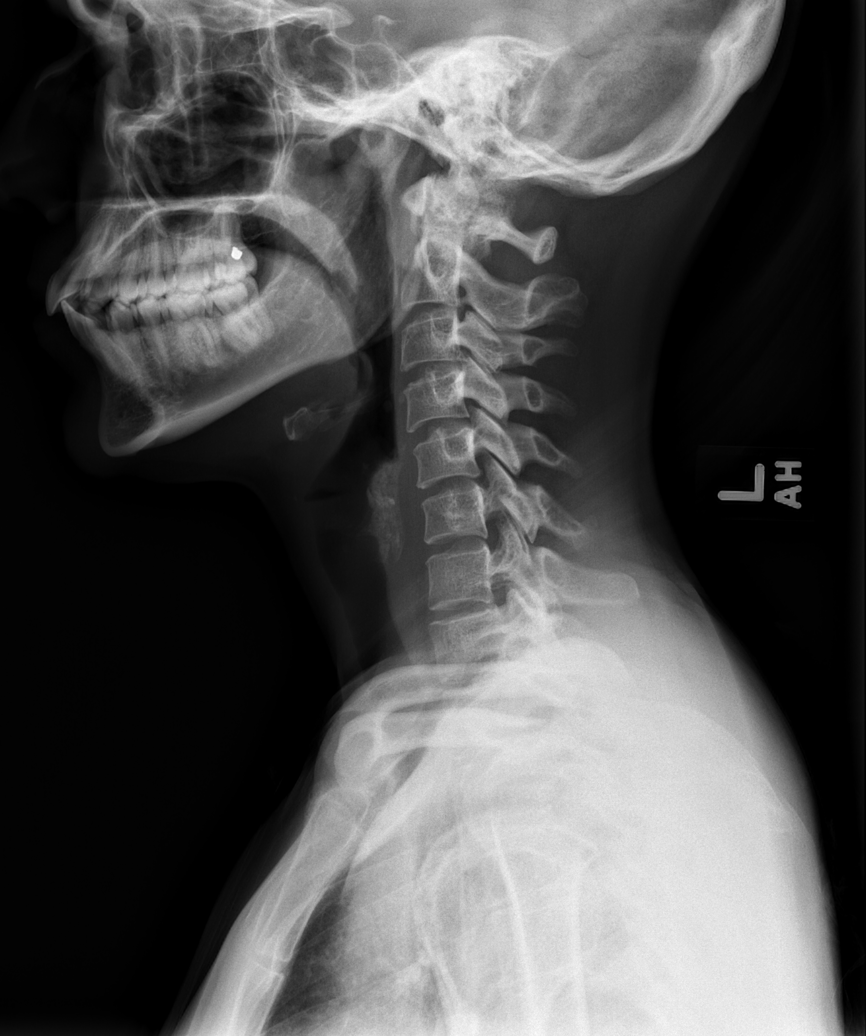

[w c-spine oblique * (1 of 2)]
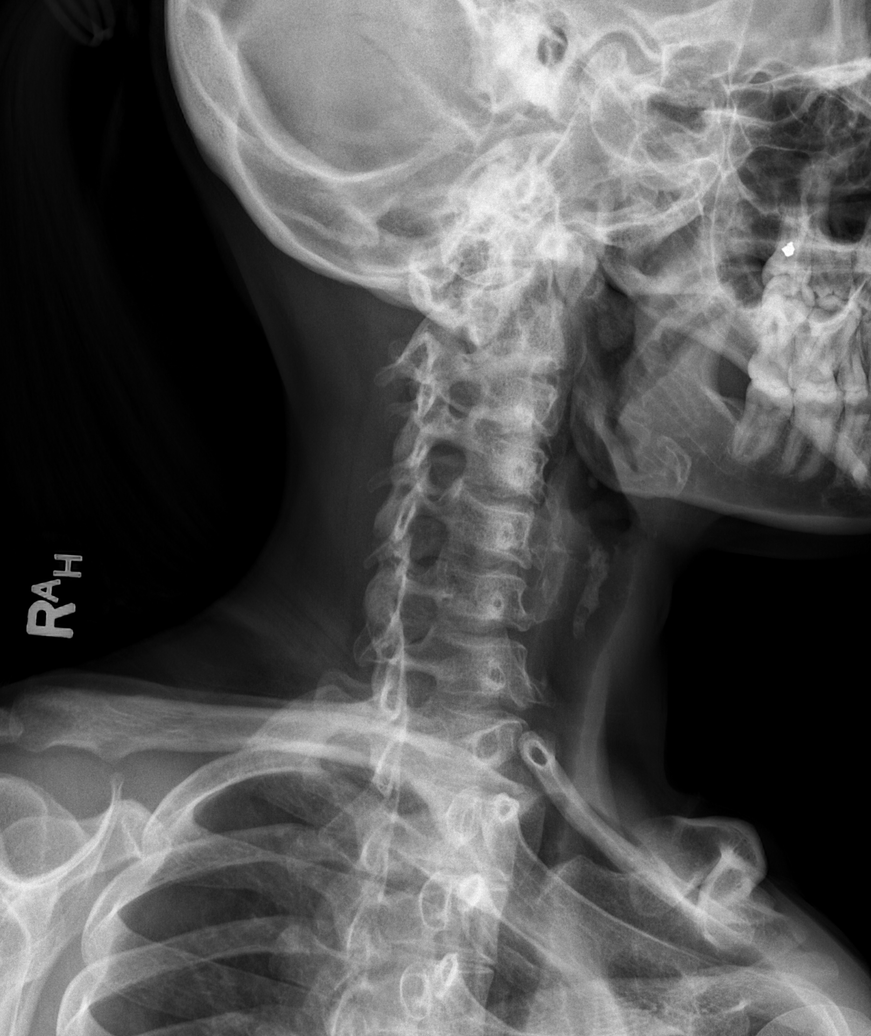

[w c-spine oblique * (2 of 2)]
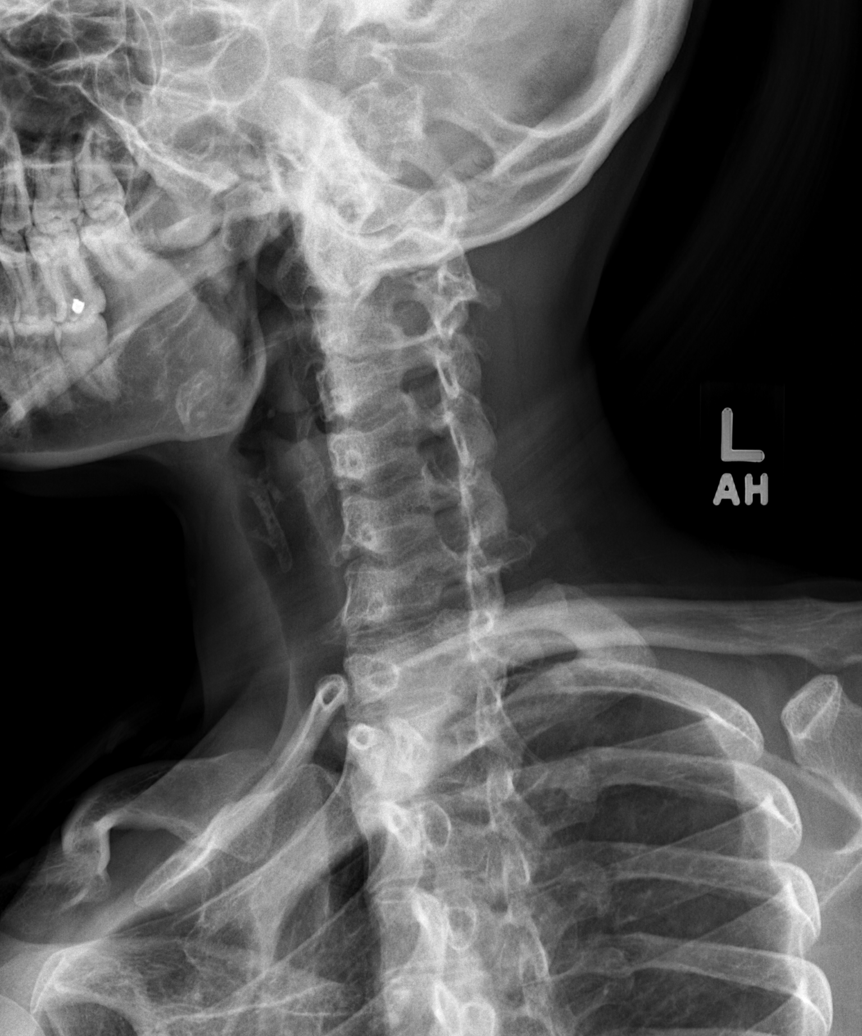

[w c-spine a.p.]
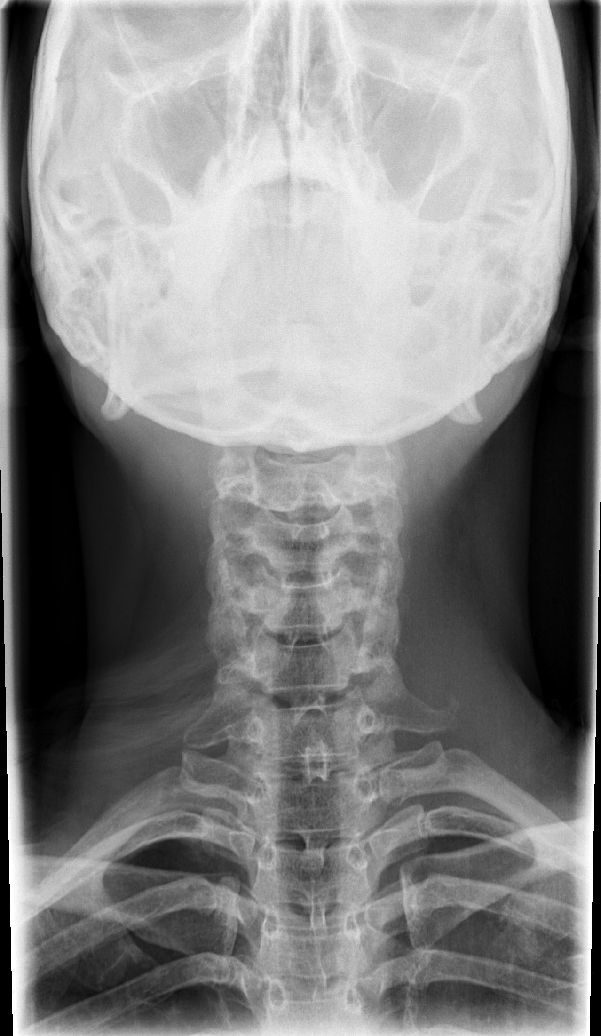

[w c-spine odontoid]
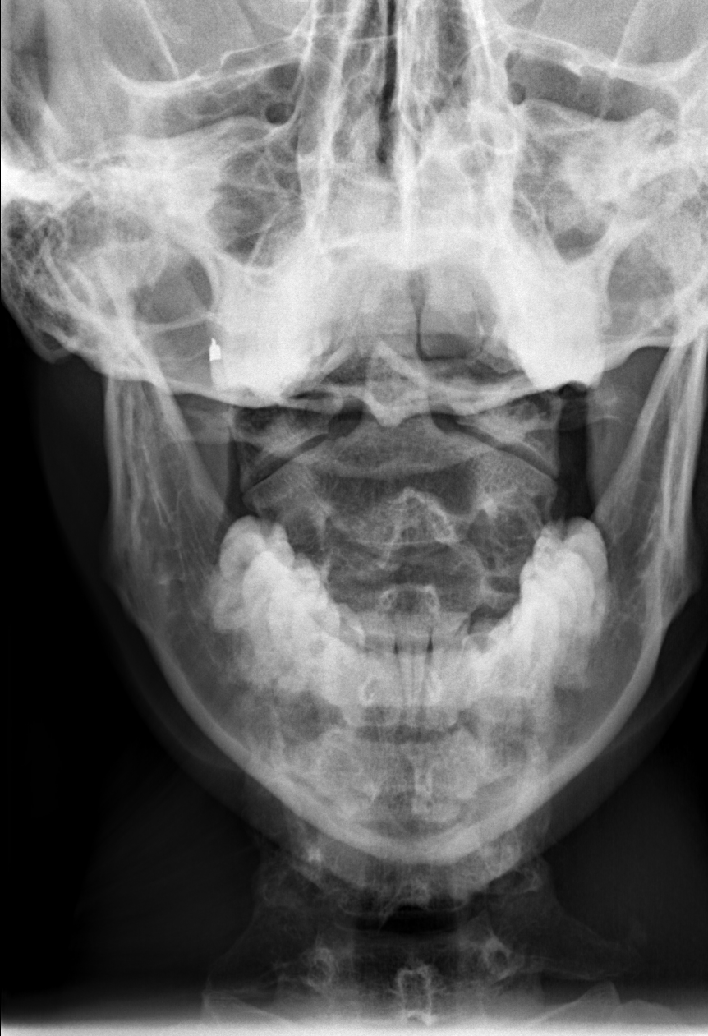

[5 of 5 positions shown; findings below may reference images not displayed]

FINDINGS: The cervical vertebrae are straightened in alignment. Intervertebral
disc spaces appear normal. No prevertebral soft tissue swelling is
seen. On oblique views the foramina are widely patent. The odontoid
process is intact. The lung apices are clear.
IMPRESSION: Straightened alignment.  Normal intervertebral disc spaces.

## 2018-08-09 ENCOUNTER — Ambulatory Visit (INDEPENDENT_AMBULATORY_CARE_PROVIDER_SITE_OTHER): Payer: Self-pay | Admitting: Medical

## 2018-08-09 ENCOUNTER — Encounter: Payer: Self-pay | Admitting: Medical

## 2018-08-09 ENCOUNTER — Other Ambulatory Visit: Payer: Self-pay

## 2018-08-09 VITALS — Temp 99.5°F

## 2018-08-09 DIAGNOSIS — J301 Allergic rhinitis due to pollen: Secondary | ICD-10-CM

## 2018-08-09 DIAGNOSIS — H9201 Otalgia, right ear: Secondary | ICD-10-CM

## 2018-08-09 DIAGNOSIS — J0100 Acute maxillary sinusitis, unspecified: Secondary | ICD-10-CM

## 2018-08-09 MED ORDER — LEVOCETIRIZINE DIHYDROCHLORIDE 5 MG PO TABS: 5.0000 mg | ORAL_TABLET | Freq: Every evening | ORAL | 3 refills | Status: AC

## 2018-08-09 MED ORDER — MONTELUKAST SODIUM 10 MG PO TABS
10.0000 mg | ORAL_TABLET | Freq: Every day | ORAL | 3 refills | Status: DC
Start: 2018-08-09 — End: 2018-12-02

## 2018-08-09 MED ORDER — FLUTICASONE PROPIONATE 50 MCG/ACT NA SUSP: 2.0000 | Freq: Every day | NASAL | 1 refills | Status: AC

## 2018-08-09 MED ORDER — AMOXICILLIN-POT CLAVULANATE 875-125 MG PO TABS: 1.0000 | ORAL_TABLET | Freq: Two times a day (BID) | ORAL | 0 refills | Status: DC

## 2018-08-09 NOTE — Progress Notes (Signed)
Subjective:    Patient ID: Laura Mckee, female    DOB: [birth year], 38 y.o.   MRN: [id number]  HPI  Virtual Visit via Video Note  I connected with Laura Mckee on 08/09/18 at 11:00 AM EDT by a video enabled telemedicine application and verified that I am speaking with the correct person using two identifiers.   I discussed the limitations of evaluation and management by telemedicine and the availability of in person appointments. The patient expressed understanding and agreed to proceed.  History of Present Illness:  Virtual Visit via Video Note  I connected with Laura Mckee on 08/09/18 at 11:00 AM EDT by a video enabled telemedicine application and verified that I am speaking with the correct person using two identifiers.   I discussed the limitations of evaluation and management by telemedicine and the availability of in person appointments. The patient expressed understanding and agreed to proceed.  History of Present Illness:  Pt states she usually see her gyn for care.  Pt works at home. No regular exercse. Diet healthy. 2 cups of coffee a day. Married- 4 children.  Pt does have severe allergies. Pt states last 2-3 weeks allergies have gotten worse than usual. She states recent ear pain last week. Sometimes some ear pressure in past with allergies. Pt states maybe fever. Temp 99.5 recently. Usually temp is 98.5. Some rt side sinus pressure. Pt states last 2 night ear pain keeping her up.  Sneezing itchy eye, pnd and mild st.   Pt has seen allergies. Hx of immune therapy about 4-5 years ago per pt.   Pt is on zyrtec.  Pt uses mirena contraceptive for years.  Pt prior use of sertaline for PMDD. Now doing well and does not need.     Observations/Objective: No acute ditress. Sounds mild congested. When she presses on rt side maxillary sinus area has pain(also some teeth discomfort)  Assessment and Plan:   Follow Up Instructions:    I discussed the  assessment and treatment plan with the patient. The patient was provided an opportunity to ask questions and all were answered. The patient agreed with the plan and demonstrated an understanding of the instructions.   The patient was advised to call back or seek an in-person evaluation if the symptoms worsen or if the condition fails to improve as anticipated.  Esperanza Richters, PA-C    Observations/Objective: No acute distress. Patient does demonstrate when she presses on her right maxillary sounds area does hurt a little bit.  Assessment and Plan: Patient has video visit today for first time.  She does have history of chronic allergic rhinitis and recent flare over the last 2 to 3 weeks.  After discussion decided to give her Xyzal, montelukast and Flonase nasal spray.  In addition patient reports right-sided maxillary sinus region pain with moderate to severe ear pain.  Concern for sinus infection and ear infection as well.  Did prescribe Augmentin antibiotic.  Explained to patient that she should gradually get better.  If her allergy symptoms persist despite the use of above allergy medications then explained might have her come in the office for Depo-Medrol/steroid injection.  Patient expressed understanding.  Follow-up in the office hopefully sometime in July for in office first time visit.  Explained to patient this would probably be the best before potential flare of COVID infections occurs in the fall.  Asked patient to bring copies of labs done with her gynecologist.  Follow Up Instructions:  I discussed the assessment and treatment plan with the patient. The patient was provided an opportunity to ask questions and all were answered. The patient agreed with the plan and demonstrated an understanding of the instructions.   The patient was advised to call back or seek an in-person evaluation if the symptoms worsen or if the condition fails to improve as anticipated.    Esperanza RichtersEdward  Jamesmichael Shadd, PA-C    Review of Systems See HPI.    Objective:   Physical Exam  See objective.      Assessment & Plan:

## 2018-08-09 NOTE — Patient Instructions (Signed)
Patient has video visit today for first time.  She does have history of chronic allergic rhinitis and recent flare over the last 2 to 3 weeks.  After discussion decided to give her Xyzal, montelukast and Flonase nasal spray.  In addition patient reports right-sided maxillary sinus region pain with moderate to severe ear pain.  Concern for sinus infection and ear infection as well.  Did prescribe Augmentin antibiotic.  Explained to patient that she should gradually get better.  If her allergy symptoms persist despite the use of above allergy medications then explained might have her come in the office for Depo-Medrol/steroid injection.  Patient expressed understanding.  Follow-up in the office hopefully sometime in July for in office first time visit.  Explained to patient this would probably be the best before potential flare of COVID infections occurs in the fall.  Asked patient to bring copies of labs done with her gynecologist.

## 2018-10-09 ENCOUNTER — Other Ambulatory Visit: Payer: Self-pay

## 2018-10-09 ENCOUNTER — Ambulatory Visit (INDEPENDENT_AMBULATORY_CARE_PROVIDER_SITE_OTHER): Payer: Self-pay | Admitting: Medical

## 2018-10-09 VITALS — BP 131/75 | HR 85 | Temp 98.5°F | Resp 16 | Ht 59.5 in | Wt 108.4 lb

## 2018-10-09 DIAGNOSIS — R0981 Nasal congestion: Secondary | ICD-10-CM

## 2018-10-09 DIAGNOSIS — J301 Allergic rhinitis due to pollen: Secondary | ICD-10-CM

## 2018-10-09 DIAGNOSIS — J0100 Acute maxillary sinusitis, unspecified: Secondary | ICD-10-CM

## 2018-10-09 DIAGNOSIS — H9201 Otalgia, right ear: Secondary | ICD-10-CM

## 2018-10-09 MED ORDER — METHYLPREDNISOLONE ACETATE 40 MG/ML IJ SUSP
40.0000 mg | Freq: Once | INTRAMUSCULAR | Status: AC
  Administered 2018-10-09: 40 mg via INTRAMUSCULAR

## 2018-10-09 MED ORDER — AZITHROMYCIN 250 MG PO TABS: ORAL_TABLET | ORAL | 0 refills | Status: DC

## 2018-10-09 NOTE — Progress Notes (Addendum)
Subjective:    Patient ID: Laura Mckee, female    DOB: [birth year], 38 y.o.   MRN: [id number]  HPI  Pt in for office visit. I had virtual visit with her first time about 2 months ago. Hx of chronic allergic rhinitis and had given her flonase, montelukast and xyzal.  At last visit which was virtual I did rx augmentin since she had some sinus pressure and ear complaints. She did not contact our office since then but did make appointment since I wanted to meet her in person before fall and potential surge in covid cases.  Pt states she did feel better after above treatment.   A week she got recurrent sinus pressure,rt ear pain and nasal congestion. This came on after she ran and old stove that her mom gave her. She turned on electric stove and she states smelled bad smell that lingered for about a week. Almost immediately got nasal congestion.   Has pnd in throat. If lays on rt side at night rt ear hurts. No cough, no fever, no sob.  Rt ear will keep her up at night. Left ear mild transient pain.   Review of Systems  Constitutional: Negative for chills and fever.  HENT: Positive for congestion, ear pain, postnasal drip, sinus pressure, sinus pain and sore throat. Negative for nosebleeds and rhinorrhea.        Faint scratchy throat.  Respiratory: Negative for cough, chest tightness, shortness of breath and wheezing.   Cardiovascular: Negative for chest pain and palpitations.  Gastrointestinal: Negative for abdominal pain.  Musculoskeletal: Negative for back pain.  Neurological: Negative for dizziness, syncope, weakness and light-headedness.  Hematological: Negative for adenopathy. Does not bruise/bleed easily.  Psychiatric/Behavioral: Negative for behavioral problems, confusion and suicidal ideas. The patient is not nervous/anxious.     Past Medical History:  Diagnosis Date  . Allergy   . GERD (gastroesophageal reflux disease)   . History of kidney stones 1999 & 2003   x 2 with  pregnancies  . PMDD (premenstrual dysphoric disorder)   . PMS (premenstrual syndrome)    depression     Social History   Socioeconomic History  . Marital status: Married    Spouse name: Not on file  . Number of children: Not on file  . Years of education: Not on file  . Highest education level: Not on file  Occupational History  . Not on file  Social Needs  . Financial resource strain: Not on file  . Food insecurity    Worry: Not on file    Inability: Not on file  . Transportation needs    Medical: Not on file    Non-medical: Not on file  Tobacco Use  . Smoking status: Former Smoker    Quit date: 08/09/2010    Years since quitting: 8.1  . Smokeless tobacco: Never Used  . Tobacco comment: 1/2 pack a day  for  3 years  Substance and Sexual Activity  . Alcohol use: No  . Drug use: No  . Sexual activity: Yes  Lifestyle  . Physical activity    Days per week: Not on file    Minutes per session: Not on file  . Stress: Not on file  Relationships  . Social Herbalist on phone: Not on file    Gets together: Not on file    Attends religious service: Not on file    Active member of club or organization: Not on file  Attends meetings of clubs or organizations: Not on file    Relationship status: Not on file  . Intimate partner violence    Fear of current or ex partner: Not on file    Emotionally abused: Not on file    Physically abused: Not on file    Forced sexual activity: Not on file  Other Topics Concern  . Not on file  Social History Narrative  . Not on file    Past Surgical History:  Procedure Laterality Date  . NO PAST SURGERIES  03/02/2011   Denies surgical history    Family History  Problem Relation Age of Onset  . Colon cancer Maternal Grandfather     No Known Allergies  Current Outpatient Medications on File Prior to Visit  Medication Sig Dispense Refill  . cetirizine (ZYRTEC) 10 MG tablet Take 10 mg by mouth daily.    . fluticasone  (FLONASE) 50 MCG/ACT nasal spray Place 2 sprays into both nostrils daily. 16 g 1  . levocetirizine (XYZAL) 5 MG tablet Take 1 tablet (5 mg total) by mouth every evening. 30 tablet 3  . methylPREDNIsolone (MEDROL DOSPACK) 4 MG tablet follow package directions 21 tablet 0  . montelukast (SINGULAIR) 10 MG tablet Take 1 tablet (10 mg total) by mouth at bedtime. 30 tablet 3  . Multiple Vitamin (MULTIVITAMIN) tablet Take 1 tablet by mouth daily.    . sertraline (ZOLOFT) 50 MG tablet Take 50 mg by mouth every morning.     . traMADol (ULTRAM) 50 MG tablet Take 1 tablet (50 mg total) by mouth every 12 (twelve) hours as needed. 30 tablet 0   No current facility-administered medications on file prior to visit.     There were no vitals taken for this visit.      Objective:   Physical Exam  General  Mental Status - Alert. General Appearance - Well groomed. Not in acute distress.  Skin Rashes- No Rashes.  HEENT Head- Normal. Ear Auditory Canal - Left- Normal. Right - Normal.Tympanic Membrane- Left- central mild redness. Right- central mild redness.  Eye Sclera/Conjunctiva- Left- Normal. Right- Normal. Nose & Sinuses Nasal Mucosa- Left-  Boggy and Congested. Right-  Boggy and  Congested.Bilateral maxillary sinus pressure but no frontal sinus pressure. Mouth & Throat Lips: Upper Lip- Normal: no dryness, cracking, pallor, cyanosis, or vesicular eruption. Lower Lip-Normal: no dryness, cracking, pallor, cyanosis or vesicular eruption. Buccal Mucosa- Bilateral- No Aphthous ulcers. Oropharynx- No Discharge or Erythema. Tonsils: Characteristics- Bilateral- No Erythema or Congestion. Size/Enlargement- Bilateral- No enlargement. Discharge- bilateral-None.  Neck Neck- Supple. No Masses.   Chest and Lung Exam Auscultation: Breath Sounds:-Clear even and unlabored.  Cardiovascular Auscultation:Rythm- Regular, rate and rhythm. Murmurs & Other Heart Sounds:Ausculatation of the heart reveal- No  Murmurs.  Lymphatic Head & Neck General Head & Neck Lymphatics: Bilateral: Description- No Localized lymphadenopathy.       Assessment & Plan:  You do appear to have allergic rhinitis/possible vasomotor rhinitis  type symptoms with nasal congestion following exposure to minimal smoke from oven. Now concern for early sinus pressure and ear infection.   Continue you current allergy treatment regimen and we gave you depomedrol 40 mg im.  Rx azithromycin for sinus infection and possible early ear infection.  Follow up 7-10 days virtual visit if symptoms persist.  Can get scheduled for wellness exam early sept. Would get cbc, cmp, and lipid panel. You could call and ask what is cost for person with no insurance.  Note pt overall presentation  appears allergy related as explained.No covid type presentation. If signs and symptoms change or worsen would recommend virtual visit.  Esperanza RichtersEdward Jill Ruppe, PA-C

## 2018-10-09 NOTE — Patient Instructions (Addendum)
You do appear to have allergic rhinitis/possible vasomotor rhinitis  type symptoms with nasal congestion following exposure to minimal smoke from oven. Now concern for early sinus pressure and ear infection.   Continue you current allergy treatment regimen and we gave you depomedrol 40 mg im.  Rx azithromycin for sinus infection and possible early ear infection.  Follow up 7-10 days virtual visit if symptoms persist.  Can get scheduled for wellness exam early sept. Would get cbc, cmp, and lipid panel. You could call and ask what is cost for person with no insurance.

## 2018-11-30 ENCOUNTER — Other Ambulatory Visit: Payer: Self-pay | Admitting: Medical

## 2019-01-09 ENCOUNTER — Other Ambulatory Visit: Payer: Self-pay

## 2019-02-05 ENCOUNTER — Other Ambulatory Visit: Payer: Self-pay

## 2019-03-25 ENCOUNTER — Other Ambulatory Visit: Payer: Self-pay | Admitting: Medical

## 2019-05-14 ENCOUNTER — Other Ambulatory Visit: Payer: Self-pay | Admitting: Medical

## 2019-05-22 ENCOUNTER — Encounter: Payer: Self-pay | Admitting: Medical

## 2019-05-22 ENCOUNTER — Other Ambulatory Visit: Payer: Self-pay

## 2019-05-22 ENCOUNTER — Ambulatory Visit (INDEPENDENT_AMBULATORY_CARE_PROVIDER_SITE_OTHER): Payer: Self-pay | Admitting: Medical

## 2019-05-22 VITALS — Temp 97.9°F | Ht 59.49 in | Wt 126.0 lb

## 2019-05-22 DIAGNOSIS — L03113 Cellulitis of right upper limb: Secondary | ICD-10-CM

## 2019-05-22 MED ORDER — DOXYCYCLINE HYCLATE 100 MG PO TABS: 100.0000 mg | ORAL_TABLET | Freq: Two times a day (BID) | ORAL | 0 refills | Status: AC

## 2019-05-22 NOTE — Progress Notes (Signed)
   Subjective:    Patient ID: Laura Mckee, female    DOB: 09/09/1980, 39 y.o.   MRN: [id number]  HPI  Virtual Visit via Video Note  I connected with Laura Mckee on 05/22/19 at  9:40 AM EST by a video enabled telemedicine application and verified that I am speaking with the correct person using two identifiers.  Location: Patient: home Provider: office   I discussed the limitations of evaluation and management by telemedicine and the availability of in person appointments. The patient expressed understanding and agreed to proceed.  History of Present Illness:   Pt has reddish pink area  rt sidemedial upper aspect of arm for 3-4 days. When she presses center has pain. Pt describes induration. No known insect bite or spider bite. No fever, no chills or sweats.  Pt states years ago had skin infection. Pt thinks the area is getting larger gradually.  Pt states in center initially looked like pimple.   LMP- has mirena.  Observations/Objective:  General-no acute distress, pleasant, oriented. Lungs- on inspection lungs appear unlabored. Neck- no tracheal deviation or jvd on inspection. Neuro- gross motor function appears intact. Rt upper arm- medial asepct upper bicep. Pinkish red area about 3 inch wide by video. Center tender and indurated per pt.  Assessment and Plan: Recent onset of rt upper ext area that by video and pt self exam appears to represent early cellulitis. Rx doxycycline antibiotic.   Area should gradually get better but if worse or persists let us know.  Follow up my chart message 5-6 days. If doing well as expected then will not need official follow up. Will follow message and then determine if video or in office needed  Follow Up Instructions:    I discussed the assessment and treatment plan with the patient. The patient was provided an opportunity to ask questions and all were answered. The patient agreed with the plan and demonstrated an understanding  of the instructions.   The patient was advised to call back or seek an in-person evaluation if the symptoms worsen or if the condition fails to improve as anticipated.  I provided 20 minutes of non-face-to-face time during this encounter.   Esperanza Richters, PA-C   Review of Systems     Objective:   Physical Exam        Assessment & Plan:

## 2019-05-22 NOTE — Patient Instructions (Addendum)
Recent onset of rt upper ext area that by video and pt self exam appears to represent early cellulitis. Rx doxycycline antibiotic.   Area should gradually get better but if worse or persists let us know.  Follow up my chart message 5-6 days. If doing well as expected then will not need official follow up. Will follow message and then determine if video or in office needed

## 2019-05-27 ENCOUNTER — Encounter: Payer: Self-pay | Admitting: Medical

## 2019-06-09 ENCOUNTER — Other Ambulatory Visit: Payer: Self-pay | Admitting: Medical

## 2019-07-04 ENCOUNTER — Other Ambulatory Visit: Payer: Self-pay | Admitting: Medical

## 2019-07-25 ENCOUNTER — Encounter: Payer: Self-pay | Admitting: Medical

## 2019-07-31 ENCOUNTER — Other Ambulatory Visit: Payer: Self-pay | Admitting: Medical

## 2020-04-14 ENCOUNTER — Telehealth: Payer: Self-pay | Admitting: Medical

## 2020-04-14 MED ORDER — MONTELUKAST SODIUM 10 MG PO TABS: 10.0000 mg | ORAL_TABLET | Freq: Every day | ORAL | 5 refills | Status: AC

## 2020-04-14 NOTE — Telephone Encounter (Signed)
Rx sent 

## 2020-04-14 NOTE — Telephone Encounter (Signed)
Medication: montelukast (SINGULAIR) 10 MG tablet    Has the patient contacted their pharmacy? No. (If no, request that the patient contact the pharmacy for the refill.) (If yes, when and what did the pharmacy advise?)  Preferred Pharmacy (with phone number or street name): [facility] - [place], [place] - [address] [address]  [address], [place] [address]  Phone:  [phone] Fax:  [phone]   Agent: Please be advised that RX refills may take up to 3 business days. We ask that you follow-up with your pharmacy.

## 2020-10-18 ENCOUNTER — Ambulatory Visit: Payer: Self-pay | Admitting: General Surgery

## 2020-10-18 NOTE — H&P (Signed)
The patient is a 40 year old female who presents with anal pain. She was referred by Dr. Conni Elliot at [facility] who performed incision and drainage of a right anterior perianal abscess on 08/19/20, releasing a copious amount of purulence.  She followed up with earlier this week on Monday, 08/23/20 at which time she was still having pain.  She underwent repeat I&D releasing a small amount of fluid.  She then completed a course of Augmentin and Flagyl.  She began to feel better after that.  She was seen in the office and felt to be healing appropriately.  She reports over the past few weeks she has noticed continued drainage from the area and continued pain with bowel movements.   Problem List/Past Medical Romie Levee, MD; 10/18/2020 3:57 PM) PERIANAL ABSCESS (K61.0)  ANAL FISTULA (K60.3)   Diagnostic Studies History Romie Levee, MD; 10/18/2020 3:57 PM) Colonoscopy  never Mammogram  never  Allergies (Raven Sneed, CMA; 10/18/2020 3:43 PM) No Known Drug Allergies  [08/27/2020]: Allergies Reconciled   Medication History Romie Levee, MD; 10/18/2020 3:57 PM) Medications Reconciled  Xyzal  (5MG  Tablet, Oral) Active.  Social History , MD; 10/18/2020 3:57 PM) Alcohol use  Occasional alcohol use. No drug use  Tobacco use  Former smoker.  Family History 10/20/2020, MD; 10/18/2020 3:57 PM) Breast Cancer  Family Members In General. Colon Cancer  Family Members In General. Malignant Neoplasm Of Pancreas  Family Members In General.  Pregnancy / Birth History 10/20/2020, MD; 10/18/2020 3:57 PM) Age at menarche  12 years. Contraceptive History  Intrauterine device. Gravida  2 Maternal age  85-20 Para  2  Other Problems 19-38, MD; 10/18/2020 3:57 PM) Hemorrhoids      Review of Systems 10/20/2020 MD; 10/18/2020 3:57 PM) General Not Present- Appetite Loss, Chills, Fatigue, Fever, Night Sweats, Weight Gain and Weight Loss. Skin Not Present- Change in  Wart/Mole, Dryness, Hives, Jaundice, New Lesions, Non-Healing Wounds, Rash and Ulcer. HEENT Present- Seasonal Allergies. Not Present- Earache, Hearing Loss, Hoarseness, Nose Bleed, Oral Ulcers, Ringing in the Ears, Sinus Pain, Sore Throat, Visual Disturbances, Wears glasses/contact lenses and Yellow Eyes. Respiratory Not Present- Bloody sputum, Chronic Cough, Difficulty Breathing, Snoring and Wheezing. Breast Not Present- Breast Mass, Breast Pain, Nipple Discharge and Skin Changes. Cardiovascular Not Present- Chest Pain, Difficulty Breathing Lying Down, Leg Cramps, Palpitations, Rapid Heart Rate, Shortness of Breath and Swelling of Extremities. Gastrointestinal Present- Hemorrhoids. Not Present- Abdominal Pain, Bloating, Bloody Stool, Change in Bowel Habits, Chronic diarrhea, Constipation, Difficulty Swallowing, Excessive gas, Gets full quickly at meals, Indigestion, Nausea, Rectal Pain and Vomiting. Female Genitourinary Not Present- Frequency, Nocturia, Painful Urination, Pelvic Pain and Urgency. Neurological Not Present- Decreased Memory, Fainting, Headaches, Numbness, Seizures, Tingling, Tremor, Trouble walking and Weakness. Psychiatric Not Present- Anxiety, Bipolar, Change in Sleep Pattern, Depression, Fearful and Frequent crying. Endocrine Not Present- Cold Intolerance, Excessive Hunger, Hair Changes, Heat Intolerance, Hot flashes and New Diabetes.   Physical Exam 10/20/2020 MD; 10/18/2020 3:55 PM)  General Mental Status - Alert. General Appearance - Cooperative.  Abdomen Palpation/Percussion Palpation and Percussion of the abdomen reveal - Soft and Non Tender.  Rectal Anorectal Exam External - Note: Right anterior ulceration consistent with external opening of fistulous tract. Probe traverses towards the midline of the anal canal.    Assessment & Plan 10/20/2020 MD; 10/18/2020 3:58 PM)  ANAL FISTULA (K60.3) Impression: 40 year old female status post I&D of a large  perirectal abscess. On exam today, she appears to have  a right anterior anal fistula. We have discussed this in detail. We discussed performing an exam under anesthesia with fistulotomy versus seton placement. We discussed the risk of fistulotomy, and the reasons to perform seton placement. We will plan on performing a fistulotomy if less than 20% of her sphincter complex is involved. We discussed the typical recovery times, as well as risk of recurrence and incontinence for both procedures. All questions were answered.

## 2021-09-16 LAB — HM COLONOSCOPY

## 2023-09-25 LAB — HM MAMMOGRAPHY

## 2023-11-27 ENCOUNTER — Ambulatory Visit: Payer: Self-pay | Admitting: Family
